# Patient Record
Sex: Male | Born: 2000
Health system: Southern US, Community
[De-identification: ages and names within clinical notes are randomized; demographics above are authoritative.]

---
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
## 2021-07-19 ENCOUNTER — Ambulatory Visit: Payer: 59 | Admitting: Family Medicine

## 2021-08-05 ENCOUNTER — Other Ambulatory Visit (HOSPITAL_COMMUNITY): Payer: Self-pay

## 2021-08-05 MED ORDER — CHLORHEXIDINE GLUCONATE 0.12 % MT SOLN
15.0000 mL | Freq: Two times a day (BID) | OROMUCOSAL | 0 refills | Status: AC
  Filled 2021-08-05: qty 473, 16d supply, fill #0

## 2021-08-05 MED ORDER — AMOXICILLIN 500 MG PO CAPS
500.0000 mg | ORAL_CAPSULE | Freq: Three times a day (TID) | ORAL | 0 refills | Status: AC
  Filled 2021-08-05: qty 21, 7d supply, fill #0

## 2021-08-05 MED ORDER — DEXAMETHASONE 4 MG PO TABS
4.0000 mg | ORAL_TABLET | Freq: Three times a day (TID) | ORAL | 0 refills | Status: AC
  Filled 2021-08-05: qty 9, 3d supply, fill #0

## 2021-08-05 MED ORDER — IBUPROFEN 400 MG PO TABS
400.0000 mg | ORAL_TABLET | ORAL | 0 refills | Status: AC | PRN
  Filled 2021-08-05: qty 30, 5d supply, fill #0

## 2021-08-06 ENCOUNTER — Other Ambulatory Visit (HOSPITAL_COMMUNITY): Payer: Self-pay

## 2021-08-06 MED ORDER — HYDROCODONE-ACETAMINOPHEN 5-325 MG PO TABS
1.0000 | ORAL_TABLET | ORAL | 0 refills | Status: AC
  Filled 2021-08-06: qty 5, 2d supply, fill #0

## 2024-02-15 ENCOUNTER — Other Ambulatory Visit (HOSPITAL_BASED_OUTPATIENT_CLINIC_OR_DEPARTMENT_OTHER): Payer: Self-pay

## 2024-02-15 MED ORDER — DIAZEPAM 10 MG PO TABS
10.0000 mg | ORAL_TABLET | Freq: Once | ORAL | 0 refills | Status: AC
  Filled 2024-02-15 – 2024-03-24 (×2): qty 1, 1d supply, fill #0

## 2024-02-25 ENCOUNTER — Other Ambulatory Visit (HOSPITAL_BASED_OUTPATIENT_CLINIC_OR_DEPARTMENT_OTHER): Payer: Self-pay

## 2024-03-24 ENCOUNTER — Other Ambulatory Visit (HOSPITAL_BASED_OUTPATIENT_CLINIC_OR_DEPARTMENT_OTHER): Payer: Self-pay
